# Patient Record
Sex: Male | Born: 2005 | Race: White | Hispanic: No | Marital: Single | Smoking: Never smoker
Health system: Southern US, Community
[De-identification: ages and names within clinical notes are randomized; demographics above are authoritative.]

---
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
## 2023-11-29 DIAGNOSIS — S39012A Strain of muscle, fascia and tendon of lower back, initial encounter: Secondary | ICD-10-CM | POA: Diagnosis not present

## 2024-01-05 DIAGNOSIS — M545 Low back pain, unspecified: Secondary | ICD-10-CM | POA: Diagnosis not present

## 2024-01-08 DIAGNOSIS — M545 Low back pain, unspecified: Secondary | ICD-10-CM | POA: Diagnosis not present

## 2024-01-16 DIAGNOSIS — J34829 Nasal valve collapse, unspecified: Secondary | ICD-10-CM | POA: Diagnosis not present

## 2024-01-16 DIAGNOSIS — J342 Deviated nasal septum: Secondary | ICD-10-CM | POA: Diagnosis not present

## 2024-01-16 DIAGNOSIS — J343 Hypertrophy of nasal turbinates: Secondary | ICD-10-CM | POA: Diagnosis not present
